# Patient Record
Sex: Female | Born: 1985
Health system: Southern US, Community
[De-identification: ages and names within clinical notes are randomized; demographics above are authoritative.]

## PROBLEM LIST (undated history)

## (undated) DIAGNOSIS — N760 Acute vaginitis: Secondary | ICD-10-CM

## (undated) DIAGNOSIS — Z23 Encounter for immunization: Secondary | ICD-10-CM

## (undated) DIAGNOSIS — R87612 Low grade squamous intraepithelial lesion on cytologic smear of cervix (LGSIL): Secondary | ICD-10-CM

## (undated) DIAGNOSIS — B009 Herpesviral infection, unspecified: Secondary | ICD-10-CM

## (undated) DIAGNOSIS — E559 Vitamin D deficiency, unspecified: Secondary | ICD-10-CM

## (undated) DIAGNOSIS — N879 Dysplasia of cervix uteri, unspecified: Secondary | ICD-10-CM

## (undated) DIAGNOSIS — A599 Trichomoniasis, unspecified: Secondary | ICD-10-CM

## (undated) DIAGNOSIS — R7303 Prediabetes: Secondary | ICD-10-CM

## (undated) DIAGNOSIS — N915 Oligomenorrhea, unspecified: Secondary | ICD-10-CM

## (undated) HISTORY — PX: TONSILLECTOMY: SUR1361

## (undated) HISTORY — DX: Prediabetes: R73.03

## (undated) HISTORY — DX: Herpesviral infection, unspecified: B00.9

## (undated) HISTORY — DX: Acute vaginitis: N76.0

## (undated) HISTORY — DX: Dysplasia of cervix uteri, unspecified: N87.9

## (undated) HISTORY — DX: Oligomenorrhea, unspecified: N91.5

## (undated) HISTORY — DX: Low grade squamous intraepithelial lesion on cytologic smear of cervix (LGSIL): R87.612

## (undated) HISTORY — DX: Encounter for immunization: Z23

## (undated) HISTORY — DX: Trichomoniasis, unspecified: A59.9

## (undated) HISTORY — PX: WISDOM TOOTH EXTRACTION: SHX21

## (undated) HISTORY — DX: Vitamin D deficiency, unspecified: E55.9

## (undated) HISTORY — PX: COLPOSCOPY: SHX161

---

## 2007-02-22 ENCOUNTER — Emergency Department: Payer: Self-pay | Admitting: Emergency Medicine
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
## 2015-09-20 DIAGNOSIS — H52223 Regular astigmatism, bilateral: Secondary | ICD-10-CM | POA: Diagnosis not present

## 2015-12-01 ENCOUNTER — Encounter: Payer: Self-pay | Admitting: Physician Assistant

## 2015-12-01 ENCOUNTER — Ambulatory Visit: Payer: Self-pay | Admitting: Physician Assistant

## 2015-12-01 VITALS — BP 128/98 | HR 76 | Temp 98.4°F

## 2015-12-01 DIAGNOSIS — R0981 Nasal congestion: Secondary | ICD-10-CM

## 2015-12-01 DIAGNOSIS — H6501 Acute serous otitis media, right ear: Secondary | ICD-10-CM

## 2015-12-01 MED ORDER — FEXOFENADINE-PSEUDOEPHED ER 60-120 MG PO TB12: 1.0000 | ORAL_TABLET | Freq: Two times a day (BID) | ORAL | Status: AC

## 2015-12-01 MED ORDER — CLARITHROMYCIN 500 MG PO TABS: 500.0000 mg | ORAL_TABLET | Freq: Two times a day (BID) | ORAL | Status: DC

## 2015-12-01 NOTE — Progress Notes (Signed)
   Subjective:right ear pain/sinus congestion    Patient ID: Colleen Hernandez, female    DOB: [birth year], 30 y.o.   MRN: [id number]  HPI Patient c/o sinus congestion for one week. Developed right ear pain 2 days ago.  Denies fever/chill, or N/V/D. Mild hearing loss right ear. No palliative measure for compliant.   Review of Systems    Hypothyroidism Objective:   Physical Exam HEENT for bilateral maxillary guarding. Edematous bilateral nasal turbinates. Edematous/erythematous right TM. Left TM is bulging with erythema. Post nasal drainage. Neck supple without adenopathy. Lungs CTA, and Heart RRR.      Assessment & Plan:Sinus congestion with right Otitis media.  Start Biaxin and Allergra-D as directed. Follow one week if no improvement.

## 2016-01-01 DIAGNOSIS — R5383 Other fatigue: Secondary | ICD-10-CM | POA: Diagnosis not present

## 2016-01-01 DIAGNOSIS — E669 Obesity, unspecified: Secondary | ICD-10-CM | POA: Diagnosis not present

## 2016-01-29 DIAGNOSIS — R5383 Other fatigue: Secondary | ICD-10-CM | POA: Diagnosis not present

## 2016-01-29 DIAGNOSIS — E559 Vitamin D deficiency, unspecified: Secondary | ICD-10-CM | POA: Diagnosis not present

## 2016-01-29 DIAGNOSIS — D519 Vitamin B12 deficiency anemia, unspecified: Secondary | ICD-10-CM | POA: Diagnosis not present

## 2016-01-29 DIAGNOSIS — E669 Obesity, unspecified: Secondary | ICD-10-CM | POA: Diagnosis not present

## 2016-01-29 DIAGNOSIS — R7301 Impaired fasting glucose: Secondary | ICD-10-CM | POA: Diagnosis not present

## 2016-02-05 DIAGNOSIS — G4710 Hypersomnia, unspecified: Secondary | ICD-10-CM | POA: Diagnosis not present

## 2016-02-05 DIAGNOSIS — E669 Obesity, unspecified: Secondary | ICD-10-CM | POA: Diagnosis not present

## 2016-02-05 DIAGNOSIS — E559 Vitamin D deficiency, unspecified: Secondary | ICD-10-CM | POA: Diagnosis not present

## 2016-02-08 DIAGNOSIS — R0683 Snoring: Secondary | ICD-10-CM | POA: Diagnosis not present

## 2016-02-08 DIAGNOSIS — G4710 Hypersomnia, unspecified: Secondary | ICD-10-CM | POA: Diagnosis not present

## 2016-03-05 DIAGNOSIS — Z315 Encounter for genetic counseling: Secondary | ICD-10-CM | POA: Diagnosis not present

## 2016-03-05 DIAGNOSIS — N915 Oligomenorrhea, unspecified: Secondary | ICD-10-CM | POA: Diagnosis not present

## 2016-03-05 DIAGNOSIS — Z01419 Encounter for gynecological examination (general) (routine) without abnormal findings: Secondary | ICD-10-CM | POA: Diagnosis not present

## 2016-03-05 DIAGNOSIS — Z8041 Family history of malignant neoplasm of ovary: Secondary | ICD-10-CM | POA: Diagnosis not present

## 2016-03-05 DIAGNOSIS — Z8742 Personal history of other diseases of the female genital tract: Secondary | ICD-10-CM | POA: Diagnosis not present

## 2016-03-05 DIAGNOSIS — Z124 Encounter for screening for malignant neoplasm of cervix: Secondary | ICD-10-CM | POA: Diagnosis not present

## 2016-03-05 DIAGNOSIS — R030 Elevated blood-pressure reading, without diagnosis of hypertension: Secondary | ICD-10-CM | POA: Diagnosis not present

## 2016-03-05 DIAGNOSIS — Z3041 Encounter for surveillance of contraceptive pills: Secondary | ICD-10-CM | POA: Diagnosis not present

## 2016-03-06 DIAGNOSIS — G4710 Hypersomnia, unspecified: Secondary | ICD-10-CM | POA: Diagnosis not present

## 2016-03-06 DIAGNOSIS — E669 Obesity, unspecified: Secondary | ICD-10-CM | POA: Diagnosis not present

## 2016-04-15 DIAGNOSIS — E669 Obesity, unspecified: Secondary | ICD-10-CM | POA: Diagnosis not present

## 2016-04-15 DIAGNOSIS — E559 Vitamin D deficiency, unspecified: Secondary | ICD-10-CM | POA: Diagnosis not present

## 2016-05-21 DIAGNOSIS — E669 Obesity, unspecified: Secondary | ICD-10-CM | POA: Diagnosis not present

## 2016-07-22 DIAGNOSIS — R5383 Other fatigue: Secondary | ICD-10-CM | POA: Diagnosis not present

## 2016-07-22 DIAGNOSIS — E669 Obesity, unspecified: Secondary | ICD-10-CM | POA: Diagnosis not present

## 2016-11-26 ENCOUNTER — Telehealth: Payer: Self-pay

## 2016-11-26 MED ORDER — LEVONORGESTREL-ETHINYL ESTRAD 0.1-20 MG-MCG PO TABS: 1.0000 | ORAL_TABLET | Freq: Every day | ORAL | 3 refills | Status: DC

## 2016-11-26 NOTE — Telephone Encounter (Signed)
Pt has changed pharmacies and need bc called in.  New pharm is [facility] on [address]  Laury AxonNeeds bcp sent in thru Sept when annual is due.  Pt aware pharm changed and rx sent.

## 2016-12-26 DIAGNOSIS — E669 Obesity, unspecified: Secondary | ICD-10-CM | POA: Diagnosis not present

## 2017-01-27 ENCOUNTER — Other Ambulatory Visit: Payer: Self-pay

## 2017-01-27 MED ORDER — LEVONORGESTREL-ETHINYL ESTRAD 0.1-20 MG-MCG PO TABS: 1.0000 | ORAL_TABLET | Freq: Every day | ORAL | 0 refills | Status: DC

## 2017-02-20 DIAGNOSIS — J309 Allergic rhinitis, unspecified: Secondary | ICD-10-CM | POA: Diagnosis not present

## 2017-02-20 DIAGNOSIS — E669 Obesity, unspecified: Secondary | ICD-10-CM | POA: Diagnosis not present

## 2017-03-26 ENCOUNTER — Encounter: Payer: Self-pay | Admitting: Obstetrics and Gynecology

## 2017-03-26 ENCOUNTER — Ambulatory Visit (INDEPENDENT_AMBULATORY_CARE_PROVIDER_SITE_OTHER): Payer: BLUE CROSS/BLUE SHIELD | Admitting: Obstetrics and Gynecology

## 2017-03-26 VITALS — BP 140/80 | HR 77 | Ht 65.0 in | Wt 249.0 lb

## 2017-03-26 DIAGNOSIS — Z8041 Family history of malignant neoplasm of ovary: Secondary | ICD-10-CM | POA: Diagnosis not present

## 2017-03-26 DIAGNOSIS — Z3041 Encounter for surveillance of contraceptive pills: Secondary | ICD-10-CM | POA: Diagnosis not present

## 2017-03-26 DIAGNOSIS — Z124 Encounter for screening for malignant neoplasm of cervix: Secondary | ICD-10-CM | POA: Diagnosis not present

## 2017-03-26 DIAGNOSIS — Z01419 Encounter for gynecological examination (general) (routine) without abnormal findings: Secondary | ICD-10-CM

## 2017-03-26 MED ORDER — LEVONORGESTREL-ETHINYL ESTRAD 0.1-20 MG-MCG PO TABS: 1.0000 | ORAL_TABLET | Freq: Every day | ORAL | 3 refills | Status: DC

## 2017-03-26 NOTE — Progress Notes (Signed)
PCP:  Carlean Jews, NP   Chief Complaint  Patient presents with  . Gynecologic Exam     HPI:      Ms. Colleen Hernandez is a 31 y.o. No obstetric history on file. who LMP was Patient's last menstrual period was 02/19/2017., presents today for her annual examination.  Her menses are regular every 28-30 days, lasting 4 days.  Dysmenorrhea none. She does not have intermenstrual bleeding.  Sex activity: not sexually active.  Last Pap: March 05, 2016  Results were: no abnormalities  Hx of STDs: HPV on pap and HSV, takes valtrex prn.   There is no FH of breast cancer. There is a FH of ovarian cancer in her mat aunt, colon cancer in her mat uncle, and stomach cancer in her MGM. Pt has declined genetic testing in the past. The patient does do self-breast exams.  Tobacco use: The patient denies current or previous tobacco use. Alcohol use: social drinker No drug use.  Exercise: moderately active  She does get adequate calcium and Vitamin D in her diet.  Doing wt loss through PCP. Labs with PCP.     Past Medical History:  Diagnosis Date  . Cervical dysplasia   . Herpes   . LGSIL on Pap smear of cervix   . Oligomenorrhea   . Pre-diabetes   . Trichomonosis   . Vaccine for human papilloma virus (HPV) types 6, 11, 16, and 18 administered   . Vitamin D deficiency   . Vulvovaginitis     Past Surgical History:  Procedure Laterality Date  . COLPOSCOPY    . TONSILLECTOMY    . WISDOM TOOTH EXTRACTION      Family History  Problem Relation Age of Onset  . Stomach cancer Maternal Grandmother 7  . Diabetes Paternal Grandmother   . Heart disease Paternal Grandfather   . Colon cancer Maternal Uncle 56  . Diabetes Maternal Uncle   . Diabetes Maternal Aunt   . Ovarian cancer Maternal Aunt 99    Social History   Social History  . Marital status: Single    Spouse name: N/A  . Number of children: N/A  . Years of education: N/A   Occupational History  . Not on file.     Social History Main Topics  . Smoking status: Never Smoker  . Smokeless tobacco: Never Used  . Alcohol use 0.0 oz/week  . Drug use: No  . Sexual activity: Not Currently   Other Topics Concern  . Not on file   Social History Narrative  . No narrative on file    Current Meds  Medication Sig  . fexofenadine-pseudoephedrine (ALLEGRA-D) 60-120 MG 12 hr tablet Take 1 tablet by mouth 2 (two) times daily.  Marland Kitchen levonorgestrel-ethinyl estradiol (AVIANE,ALESSE,LESSINA) 0.1-20 MG-MCG tablet Take 1 tablet by mouth daily.  . [DISCONTINUED] levonorgestrel-ethinyl estradiol (AVIANE,ALESSE,LESSINA) 0.1-20 MG-MCG tablet Take 1 tablet by mouth daily.     ROS:  Review of Systems  Constitutional: Negative for fatigue, fever and unexpected weight change.  Respiratory: Negative for cough, shortness of breath and wheezing.   Cardiovascular: Negative for chest pain, palpitations and leg swelling.  Gastrointestinal: Negative for blood in stool, constipation, diarrhea, nausea and vomiting.  Endocrine: Negative for cold intolerance, heat intolerance and polyuria.  Genitourinary: Negative for dyspareunia, dysuria, flank pain, frequency, genital sores, hematuria, menstrual problem, pelvic pain, urgency, vaginal bleeding, vaginal discharge and vaginal pain.  Musculoskeletal: Negative for back pain, joint swelling and myalgias.  Skin: Negative for rash.  Neurological: Negative for dizziness, syncope, light-headedness, numbness and headaches.  Hematological: Negative for adenopathy.  Psychiatric/Behavioral: Negative for agitation, confusion, sleep disturbance and suicidal ideas. The patient is not nervous/anxious.      Objective: BP 140/80   Pulse 77   Ht 5\' 5"  (1.651 m)   Wt 249 lb (112.9 kg)   LMP 02/19/2017   BMI 41.44 kg/m    Physical Exam  Constitutional: She is oriented to person, place, and time. She appears well-developed and well-nourished.  Genitourinary: Vagina normal and uterus  normal. There is no rash or tenderness on the right labia. There is no rash or tenderness on the left labia. No erythema or tenderness in the vagina. No vaginal discharge found. Right adnexum does not display mass and does not display tenderness. Left adnexum does not display mass and does not display tenderness. Cervix does not exhibit motion tenderness or polyp. Uterus is not enlarged or tender.  Neck: Normal range of motion. No thyromegaly present.  Cardiovascular: Normal rate, regular rhythm and normal heart sounds.   No murmur heard. Pulmonary/Chest: Effort normal and breath sounds normal. Right breast exhibits no mass, no nipple discharge, no skin change and no tenderness. Left breast exhibits no mass, no nipple discharge, no skin change and no tenderness.  Abdominal: Soft. There is no tenderness. There is no guarding.  Musculoskeletal: Normal range of motion.  Neurological: She is alert and oriented to person, place, and time. No cranial nerve deficit.  Psychiatric: She has a normal mood and affect. Her behavior is normal.  Vitals reviewed.   Assessment/Plan: Encounter for annual routine gynecological examination  Cervical cancer screening - Plan: IGP, rfx Aptima HPV ASCU  Encounter for surveillance of contraceptive pills - OCP RF - Plan: levonorgestrel-ethinyl estradiol (AVIANE,ALESSE,LESSINA) 0.1-20 MG-MCG tablet  Family history of ovarian cancer - MyRisk testing discussed given FH of Lynch syndrome cancers. Pt declines. F/u if desires.             GYN counsel adequate intake of calcium and vitamin D, diet and exercise     F/U  Return in about 1 year (around 03/26/2018).  Jayant Kriz B. Royalti Schauf, PA-C 03/26/2017 4:24 PM

## 2017-03-27 ENCOUNTER — Other Ambulatory Visit: Payer: Self-pay | Admitting: Obstetrics and Gynecology

## 2017-03-27 DIAGNOSIS — Z3041 Encounter for surveillance of contraceptive pills: Secondary | ICD-10-CM

## 2017-03-27 MED ORDER — LEVONORGESTREL-ETHINYL ESTRAD 0.1-20 MG-MCG PO TABS: 1.0000 | ORAL_TABLET | Freq: Every day | ORAL | 3 refills | Status: DC

## 2017-03-28 LAB — IGP, RFX APTIMA HPV ASCU: PAP SMEAR COMMENT: 0

## 2017-12-03 ENCOUNTER — Telehealth: Payer: Self-pay

## 2017-12-03 NOTE — Telephone Encounter (Signed)
Pt states she needs a referral so she can get laser hair removal done because she has PCOS and would like her insurance to cover it and be filed under medically necessary. Please advise. Thank you!

## 2017-12-03 NOTE — Telephone Encounter (Signed)
Where does she want to be referred to? Don't know that insurance will cover it.

## 2017-12-04 ENCOUNTER — Other Ambulatory Visit: Payer: Self-pay | Admitting: Obstetrics and Gynecology

## 2017-12-04 DIAGNOSIS — L680 Hirsutism: Secondary | ICD-10-CM

## 2017-12-04 NOTE — Telephone Encounter (Signed)
Ref order placed and Harriett Sineancy working on this.

## 2017-12-04 NOTE — Progress Notes (Signed)
Pt states she needs a referral so she can get laser hair removal done because she has PCOS and would like her insurance to cover it and be filed under medically necessary.

## 2017-12-04 NOTE — Telephone Encounter (Signed)
Called pt and she would like to be referred to [facility] located in [place].

## 2018-01-12 DIAGNOSIS — H52223 Regular astigmatism, bilateral: Secondary | ICD-10-CM | POA: Diagnosis not present

## 2018-05-05 ENCOUNTER — Other Ambulatory Visit (HOSPITAL_COMMUNITY)
Admission: RE | Admit: 2018-05-05 | Discharge: 2018-05-05 | Disposition: A | Discharge status: Home / Self Care | Payer: BLUE CROSS/BLUE SHIELD | Source: Ambulatory Visit | Attending: Obstetrics and Gynecology | Admitting: Obstetrics and Gynecology

## 2018-05-05 ENCOUNTER — Ambulatory Visit (INDEPENDENT_AMBULATORY_CARE_PROVIDER_SITE_OTHER): Payer: BLUE CROSS/BLUE SHIELD | Admitting: Obstetrics and Gynecology

## 2018-05-05 ENCOUNTER — Encounter: Payer: Self-pay | Admitting: Obstetrics and Gynecology

## 2018-05-05 VITALS — BP 116/80 | HR 64 | Ht 65.0 in | Wt 255.0 lb

## 2018-05-05 DIAGNOSIS — Z8041 Family history of malignant neoplasm of ovary: Secondary | ICD-10-CM

## 2018-05-05 DIAGNOSIS — Z1151 Encounter for screening for human papillomavirus (HPV): Secondary | ICD-10-CM | POA: Diagnosis not present

## 2018-05-05 DIAGNOSIS — E282 Polycystic ovarian syndrome: Secondary | ICD-10-CM

## 2018-05-05 DIAGNOSIS — Z01419 Encounter for gynecological examination (general) (routine) without abnormal findings: Secondary | ICD-10-CM | POA: Diagnosis not present

## 2018-05-05 DIAGNOSIS — Z124 Encounter for screening for malignant neoplasm of cervix: Secondary | ICD-10-CM | POA: Insufficient documentation

## 2018-05-05 DIAGNOSIS — Z3041 Encounter for surveillance of contraceptive pills: Secondary | ICD-10-CM

## 2018-05-05 MED ORDER — LEVONORGESTREL-ETHINYL ESTRAD 0.1-20 MG-MCG PO TABS: 1.0000 | ORAL_TABLET | Freq: Every day | ORAL | 3 refills | Status: DC

## 2018-05-05 NOTE — Patient Instructions (Signed)
I value your feedback and entrusting us with your care. If you get a [facility] patient survey, I would appreciate you taking the time to let us know about your experience today. Thank you! 

## 2018-05-05 NOTE — Progress Notes (Signed)
PCP:  Carlean JewsBoscia, Heather E, NP   Chief Complaint  Patient presents with  . Gynecologic Exam     HPI:      Ms. Colleen Hernandez is a 32 y.o. No obstetric history on file. who LMP was Patient's last menstrual period was 05/03/2018 (exact date)., presents today for her annual examination.  Her menses are regular every 28-30 days, lasting 4 days.  Dysmenorrhea mild before menses, sx increased this yr. Takes NSAIDs with sx relief. She does not have intermenstrual bleeding. Takes OCPs for cycle control due to PCOS.  Sex activity: not sexually active.  Last Pap: 03/26/17 Results were: no abnormalities  Hx of STDs: HPV on pap and HSV, takes valtrex prn.   There is no FH of breast cancer. There is a FH of ovarian cancer in her mat aunt, colon cancer in her mat uncle, and stomach cancer in her MGM. Pt has declined genetic testing last yr and in the past. The patient does do self-breast exams.  Tobacco use: The patient denies current or previous tobacco use. Alcohol use: social drinker No drug use.  Exercise: moderately active  She does get adequate calcium and Vitamin D in her diet.  Doing wt loss through PCP. Labs with PCP.    Past Medical History:  Diagnosis Date  . Cervical dysplasia   . Herpes   . LGSIL on Pap smear of cervix   . Oligomenorrhea   . Pre-diabetes   . Trichomonosis   . Vaccine for human papilloma virus (HPV) types 6, 11, 16, and 18 administered   . Vitamin D deficiency   . Vulvovaginitis     Past Surgical History:  Procedure Laterality Date  . COLPOSCOPY    . TONSILLECTOMY    . WISDOM TOOTH EXTRACTION      Family History  Problem Relation Age of Onset  . Stomach cancer Maternal Grandmother [id number]  . Diabetes Paternal Grandmother   . Heart disease Paternal Grandfather   . Colon cancer Maternal Uncle 56  . Diabetes Maternal Uncle   . Diabetes Maternal Aunt   . Ovarian cancer Maternal Aunt 5256    Social History   Socioeconomic History  . Marital status:  Single    Spouse name: Not on file  . Number of children: Not on file  . Years of education: Not on file  . Highest education level: Not on file  Occupational History  . Not on file  Social Needs  . Financial resource strain: Not on file  . Food insecurity:    Worry: Not on file    Inability: Not on file  . Transportation needs:    Medical: Not on file    Non-medical: Not on file  Tobacco Use  . Smoking status: Never Smoker  . Smokeless tobacco: Never Used  Substance and Sexual Activity  . Alcohol use: Yes    Alcohol/week: 0.0 standard drinks    Comment: socially  . Drug use: No  . Sexual activity: Not Currently    Birth control/protection: Pill  Lifestyle  . Physical activity:    Days per week: Not on file    Minutes per session: Not on file  . Stress: Not on file  Relationships  . Social connections:    Talks on phone: Not on file    Gets together: Not on file    Attends religious service: Not on file    Active member of club or organization: Not on file    Attends meetings  of clubs or organizations: Not on file    Relationship status: Not on file  . Intimate partner violence:    Fear of current or ex partner: Not on file    Emotionally abused: Not on file    Physically abused: Not on file    Forced sexual activity: Not on file  Other Topics Concern  . Not on file  Social History Narrative  . Not on file    Current Meds  Medication Sig  . fexofenadine-pseudoephedrine (ALLEGRA-D) 60-120 MG 12 hr tablet Take 1 tablet by mouth 2 (two) times daily.  Marland Kitchen levonorgestrel-ethinyl estradiol (AVIANE,ALESSE,LESSINA) 0.1-20 MG-MCG tablet Take 1 tablet by mouth daily.  . phentermine (ADIPEX-P) 37.5 MG tablet Take 37.5 mg by mouth daily.  . [DISCONTINUED] levonorgestrel-ethinyl estradiol (AVIANE,ALESSE,LESSINA) 0.1-20 MG-MCG tablet Take 1 tablet by mouth daily.     ROS:  Review of Systems  Constitutional: Negative for fatigue, fever and unexpected weight change.    Respiratory: Negative for cough, shortness of breath and wheezing.   Cardiovascular: Negative for chest pain, palpitations and leg swelling.  Gastrointestinal: Negative for blood in stool, constipation, diarrhea, nausea and vomiting.  Endocrine: Negative for cold intolerance, heat intolerance and polyuria.  Genitourinary: Negative for dyspareunia, dysuria, flank pain, frequency, genital sores, hematuria, menstrual problem, pelvic pain, urgency, vaginal bleeding, vaginal discharge and vaginal pain.  Musculoskeletal: Negative for back pain, joint swelling and myalgias.  Skin: Negative for rash.  Neurological: Negative for dizziness, syncope, light-headedness, numbness and headaches.  Hematological: Negative for adenopathy.  Psychiatric/Behavioral: Negative for agitation, confusion, sleep disturbance and suicidal ideas. The patient is not nervous/anxious.      Objective: BP 116/80   Pulse 64   Ht 5\' 5"  (1.651 m)   Wt 255 lb (115.7 kg)   LMP 05/03/2018 (Exact Date)   BMI 42.43 kg/m    Physical Exam  Constitutional: She is oriented to person, place, and time. She appears well-developed and well-nourished.  Genitourinary: Uterus normal. There is no rash or tenderness on the right labia. There is no rash or tenderness on the left labia. There is bleeding in the vagina. No erythema or tenderness in the vagina. No vaginal discharge found. Right adnexum does not display mass and does not display tenderness. Left adnexum does not display mass and does not display tenderness. Cervix does not exhibit motion tenderness or polyp. Uterus is not enlarged or tender.  Neck: Normal range of motion. No thyromegaly present.  Cardiovascular: Normal rate, regular rhythm and normal heart sounds.  No murmur heard. Pulmonary/Chest: Effort normal and breath sounds normal. Right breast exhibits no mass, no nipple discharge, no skin change and no tenderness. Left breast exhibits no mass, no nipple discharge, no  skin change and no tenderness.  Abdominal: Soft. There is no tenderness. There is no guarding.  Musculoskeletal: Normal range of motion.  Neurological: She is alert and oriented to person, place, and time. No cranial nerve deficit.  Psychiatric: She has a normal mood and affect. Her behavior is normal.  Vitals reviewed.   Assessment/Plan: Encounter for annual routine gynecological examination  Cervical cancer screening - Plan: Cytology - PAP  Screening for HPV (human papillomavirus) - Plan: Cytology - PAP  Encounter for surveillance of contraceptive pills - OCP RF - Plan: levonorgestrel-ethinyl estradiol (AVIANE,ALESSE,LESSINA) 0.1-20 MG-MCG tablet  PCOS (polycystic ovarian syndrome) - Plan: levonorgestrel-ethinyl estradiol (AVIANE,ALESSE,LESSINA) 0.1-20 MG-MCG tablet  Family history of ovarian cancer - MyRisk testing discussed. Pt declines. F/u prn.   Meds ordered this encounter  Medications  . levonorgestrel-ethinyl estradiol (AVIANE,ALESSE,LESSINA) 0.1-20 MG-MCG tablet    Sig: Take 1 tablet by mouth daily.    Dispense:  84 tablet    Refill:  3    Order Specific Question:   Supervising Provider    Answer:   Nadara Mustard [[id number]]              GYN counsel adequate intake of calcium and vitamin D, diet and exercise     F/U  Return in about 1 year (around 05/06/2019).  Cruz Bong B. Ludell Zacarias, PA-C 05/05/2018 4:35 PM

## 2018-05-11 LAB — CYTOLOGY - PAP
DIAGNOSIS: NEGATIVE
HPV: NOT DETECTED

## 2019-04-16 ENCOUNTER — Other Ambulatory Visit: Payer: Self-pay

## 2019-04-16 ENCOUNTER — Encounter: Payer: Self-pay | Admitting: Nurse Practitioner

## 2019-04-16 ENCOUNTER — Ambulatory Visit (INDEPENDENT_AMBULATORY_CARE_PROVIDER_SITE_OTHER): Payer: BLUE CROSS/BLUE SHIELD | Admitting: Nurse Practitioner

## 2019-04-16 VITALS — BP 120/73 | HR 76 | Temp 98.0°F | Resp 16 | Ht 65.0 in | Wt 251.0 lb

## 2019-04-16 DIAGNOSIS — R635 Abnormal weight gain: Secondary | ICD-10-CM

## 2019-04-16 DIAGNOSIS — R300 Dysuria: Secondary | ICD-10-CM | POA: Diagnosis not present

## 2019-04-16 DIAGNOSIS — Z8249 Family history of ischemic heart disease and other diseases of the circulatory system: Secondary | ICD-10-CM | POA: Diagnosis not present

## 2019-04-16 DIAGNOSIS — Z0001 Encounter for general adult medical examination with abnormal findings: Secondary | ICD-10-CM | POA: Diagnosis not present

## 2019-04-16 NOTE — Progress Notes (Signed)
[facility] [address] [place], [place] [address]  Internal MEDICINE  Office Visit Note  Patient Name: Colleen Hernandez  [id number]  [id number]  Date of Service: 05/02/2019   Pt is here for routine health maintenance examination  Chief Complaint  Patient presents with  . Annual Exam     Ms. Beverlin presents to clinic for an annual physical exam. She reports that she has a strong family history of heart disease, and wants to stay on top of her health status. Her father had a heart transplant at 68, her uncle had to have LVAD, her grandfather passed away from MI at a young age. Ms. Pelaez denies any congenital heart problems. She reports that occasionally she has chest pressure or palpitations when feeling stressed or anxious, but denies any symptoms at rest. She has previously taken phentermine for weight management, but states that due to potential cardiac-related side effects, she prefers not to restart it at this time. She states that she is continuing to work on her weight through diet and exercise, and has a goal to lose 20-30 lbs.    Current Medication: Outpatient Encounter Medications as of 04/16/2019  Medication Sig  . fexofenadine-pseudoephedrine (ALLEGRA-D) 60-120 MG 12 hr tablet Take 1 tablet by mouth 2 (two) times daily.  Marland Kitchen levonorgestrel-ethinyl estradiol (AVIANE,ALESSE,LESSINA) 0.1-20 MG-MCG tablet Take 1 tablet by mouth daily.  . phentermine (ADIPEX-P) 37.5 MG tablet Take 37.5 mg by mouth daily.   No facility-administered encounter medications on file as of 04/16/2019.     Surgical History: Past Surgical History:  Procedure Laterality Date  . COLPOSCOPY    . TONSILLECTOMY    . WISDOM TOOTH EXTRACTION      Medical History: Past Medical History:  Diagnosis Date  . Cervical dysplasia   . Herpes   . LGSIL on Pap smear of cervix   . Oligomenorrhea   . Pre-diabetes   . Trichomonosis   . Vaccine for human papilloma virus (HPV) types 6, 11, 16, and  18 administered   . Vitamin D deficiency   . Vulvovaginitis     Family History: Family History  Problem Relation Age of Onset  . Stomach cancer Maternal Grandmother 96  . Diabetes Paternal Grandmother   . Heart disease Paternal Grandfather   . Colon cancer Maternal Uncle 56  . Diabetes Maternal Uncle   . Diabetes Maternal Aunt   . Ovarian cancer Maternal Aunt 56  . Heart disease Father       Review of Systems  Constitutional: Negative for chills, fatigue and fever.  HENT: Positive for postnasal drip and sinus pressure. Negative for ear pain, rhinorrhea, sore throat and tinnitus.        Symptoms related to seasonal allergies  Eyes: Negative for photophobia, pain and visual disturbance.  Respiratory: Negative for shortness of breath and wheezing.   Cardiovascular: Negative for chest pain, palpitations and leg swelling.  Gastrointestinal: Negative for abdominal pain, diarrhea, nausea and vomiting.  Endocrine: Negative for cold intolerance, heat intolerance and polydipsia.  Genitourinary: Negative for difficulty urinating, dysuria, frequency and vaginal bleeding.  Musculoskeletal: Negative for arthralgias, myalgias and neck stiffness.  Skin: Negative for rash and wound.  Allergic/Immunologic: Positive for environmental allergies. Negative for food allergies.  Neurological: Negative for dizziness, numbness and headaches.  Hematological: Negative for adenopathy.  Psychiatric/Behavioral: Negative for decreased concentration. The patient is nervous/anxious.        Reports occasionally feeling stressed or anxious related to work and life events  Today's Vitals   04/16/19 1502  BP: 120/73  Pulse: 76  Resp: 16  Temp: 98 F (36.7 C)  SpO2: 98%  Weight: 251 lb (113.9 kg)  Height: 5\' 5"  (1.651 m)   Body mass index is 41.77 kg/m.  Physical Exam Vitals signs reviewed.  Constitutional:      Appearance: Normal appearance.  HENT:     Head: Normocephalic and atraumatic.      Nose: Nose normal.  Eyes:     Extraocular Movements: Extraocular movements intact.     Pupils: Pupils are equal, round, and reactive to light.  Neck:     Musculoskeletal: Normal range of motion and neck supple.  Cardiovascular:     Rate and Rhythm: Normal rate and regular rhythm.     Pulses: Normal pulses.     Heart sounds: Normal heart sounds.  Pulmonary:     Effort: Pulmonary effort is normal.     Breath sounds: Normal breath sounds.  Abdominal:     General: Bowel sounds are normal.     Palpations: Abdomen is soft.     Tenderness: There is no abdominal tenderness.  Musculoskeletal: Normal range of motion.  Skin:    General: Skin is warm and dry.     Capillary Refill: Capillary refill takes less than 2 seconds.  Neurological:     Mental Status: She is alert and oriented to person, place, and time.  Psychiatric:        Mood and Affect: Mood normal.        Behavior: Behavior normal.        Thought Content: Thought content normal.        Judgment: Judgment normal.   LABS: Recent Results (from the past 2160 hour(s))  Comprehensive metabolic panel     Status: None   Collection Time: 04/19/19 10:01 AM  Result Value Ref Range   Glucose 93 65 - 99 mg/dL   BUN 13 6 - 20 mg/dL   Creatinine, Ser 0.68 0.57 - 1.00 mg/dL   GFR calc non Af Amer 117 >59 mL/min/1.73   GFR calc Af Amer 135 >59 mL/min/1.73   BUN/Creatinine Ratio 19 9 - 23   Sodium 137 134 - 144 mmol/L   Potassium 4.1 3.5 - 5.2 mmol/L   Chloride 104 96 - 106 mmol/L   CO2 21 20 - 29 mmol/L   Calcium 8.9 8.7 - 10.2 mg/dL   Total Protein 6.3 6.0 - 8.5 g/dL   Albumin 4.0 3.8 - 4.8 g/dL   Globulin, Total 2.3 1.5 - 4.5 g/dL   Albumin/Globulin Ratio 1.7 1.2 - 2.2   Bilirubin Total 0.3 0.0 - 1.2 mg/dL   Alkaline Phosphatase 51 39 - 117 IU/L   AST 20 0 - 40 IU/L   ALT 19 0 - 32 IU/L  CBC     Status: None   Collection Time: 04/19/19 10:01 AM  Result Value Ref Range   WBC 7.0 3.4 - 10.8 x10E3/uL   RBC 4.14 3.77 - 5.28  x10E6/uL   Hemoglobin 12.1 11.1 - 15.9 g/dL   Hematocrit 35.7 34.0 - 46.6 %   MCV 86 79 - 97 fL   MCH 29.2 26.6 - 33.0 pg   MCHC 33.9 31.5 - 35.7 g/dL   RDW 12.9 11.7 - 15.4 %   Platelets 261 150 - 450 x10E3/uL  Lipid Panel w/o Chol/HDL Ratio     Status: None   Collection Time: 04/19/19 10:01 AM  Result Value Ref Range  Cholesterol, Total 174 100 - 199 mg/dL   Triglycerides 35 0 - 149 mg/dL   HDL 67 >36>39 mg/dL   VLDL Cholesterol Cal 8 5 - 40 mg/dL   LDL Chol Calc (NIH) 99 0 - 99 mg/dL  Hgb U4QA1c w/o eAG     Status: None   Collection Time: 04/19/19 10:01 AM  Result Value Ref Range   Hgb A1c MFr Bld 5.6 4.8 - 5.6 %    Comment:          Prediabetes: 5.7 - 6.4          Diabetes: >6.4          Glycemic control for adults with diabetes: <7.0   T4, free     Status: None   Collection Time: 04/19/19 10:01 AM  Result Value Ref Range   Free T4 1.10 0.82 - 1.77 ng/dL  TSH     Status: None   Collection Time: 04/19/19 10:01 AM  Result Value Ref Range   TSH 1.950 0.450 - 4.500 uIU/mL  VITAMIN D 25 Hydroxy (Vit-D Deficiency, Fractures)     Status: Abnormal   Collection Time: 04/19/19 10:01 AM  Result Value Ref Range   Vit D, 25-Hydroxy 28.8 (L) 30.0 - 100.0 ng/mL    Comment: Vitamin D deficiency has been defined by the Institute of Medicine and an Endocrine Society practice guideline as a level of serum 25-OH vitamin D less than 20 ng/mL (1,2). The Endocrine Society went on to further define vitamin D insufficiency as a level between 21 and 29 ng/mL (2). 1. IOM (Institute of Medicine). 2010. Dietary reference    intakes for calcium and D. [place]: The    [employer]. 2. Holick MF, Binkley [place], Bischoff-Ferrari HA, et al.    Evaluation, treatment, and prevention of vitamin D    deficiency: an Endocrine Society clinical practice    guideline. JCEM. 2011 Jul; 96(7):1911-30.     Assessment/Plan: 1. Encounter for general adult medical examination with abnormal  findings Annual health maintenance exam today  2. Family history of premature CAD Will get echo and stress test for further evaluation.  - ECHOCARDIOGRAM COMPLETE; Future - stress test; Future  3. Abnormal weight gain Patient plans to follow low calorie diet, 1200-1500 calories per day. Will incorporate exercise into daily routine.   4. Dysuria - UA/M w/rflx Culture, Routine  General Counseling: Anastasija verbalizes understanding of the findings of todays visit and agrees with plan of treatment. I have discussed any further diagnostic evaluation that may be needed or ordered today. We also reviewed her medications today. she has been encouraged to call the office with any questions or concerns that should arise related to todays visit.    Counseling:  This patient was seen by Vincent GrosHeather Shaquinta Peruski FNP Collaboration with Dr Lyndon CodeFozia M Khan as a part of collaborative care agreement  Orders Placed This Encounter  Procedures  . UA/M w/rflx Culture, Routine  . stress test  . ECHOCARDIOGRAM COMPLETE      Time spent: 1930 Minutes      Lyndon CodeFozia M Khan, MD  Internal Medicine

## 2019-04-19 ENCOUNTER — Other Ambulatory Visit: Payer: Self-pay | Admitting: Nurse Practitioner

## 2019-04-19 DIAGNOSIS — Z0001 Encounter for general adult medical examination with abnormal findings: Secondary | ICD-10-CM | POA: Diagnosis not present

## 2019-04-19 DIAGNOSIS — R7301 Impaired fasting glucose: Secondary | ICD-10-CM | POA: Diagnosis not present

## 2019-04-19 DIAGNOSIS — E559 Vitamin D deficiency, unspecified: Secondary | ICD-10-CM | POA: Diagnosis not present

## 2019-04-19 DIAGNOSIS — R635 Abnormal weight gain: Secondary | ICD-10-CM | POA: Diagnosis not present

## 2019-04-20 LAB — COMPREHENSIVE METABOLIC PANEL
ALT: 19 IU/L (ref 0–32)
AST: 20 IU/L (ref 0–40)
Albumin/Globulin Ratio: 1.7 (ref 1.2–2.2)
Albumin: 4 g/dL (ref 3.8–4.8)
Alkaline Phosphatase: 51 IU/L (ref 39–117)
BUN/Creatinine Ratio: 19 (ref 9–23)
BUN: 13 mg/dL (ref 6–20)
Bilirubin Total: 0.3 mg/dL (ref 0.0–1.2)
CO2: 21 mmol/L (ref 20–29)
Calcium: 8.9 mg/dL (ref 8.7–10.2)
Chloride: 104 mmol/L (ref 96–106)
Creatinine, Ser: 0.68 mg/dL (ref 0.57–1.00)
GFR calc Af Amer: 135 mL/min/{1.73_m2} (ref >59)
GFR calc non Af Amer: 117 mL/min/{1.73_m2} (ref >59)
Globulin, Total: 2.3 g/dL (ref 1.5–4.5)
Glucose: 93 mg/dL (ref 65–99)
Potassium: 4.1 mmol/L (ref 3.5–5.2)
Sodium: 137 mmol/L (ref 134–144)
Total Protein: 6.3 g/dL (ref 6.0–8.5)

## 2019-04-20 LAB — CBC
Hematocrit: 35.7 % (ref 34.0–46.6)
Hemoglobin: 12.1 g/dL (ref 11.1–15.9)
MCH: 29.2 pg (ref 26.6–33.0)
MCHC: 33.9 g/dL (ref 31.5–35.7)
MCV: 86 fL (ref 79–97)
Platelets: 261 10*3/uL (ref 150–450)
RBC: 4.14 x10E6/uL (ref 3.77–5.28)
RDW: 12.9 % (ref 11.7–15.4)
WBC: 7 10*3/uL (ref 3.4–10.8)

## 2019-04-20 LAB — LIPID PANEL W/O CHOL/HDL RATIO
Cholesterol, Total: 174 mg/dL (ref 100–199)
HDL: 67 mg/dL (ref >39)
LDL Chol Calc (NIH): 99 mg/dL (ref 0–99)
Triglycerides: 35 mg/dL (ref 0–149)
VLDL Cholesterol Cal: 8 mg/dL (ref 5–40)

## 2019-04-20 LAB — VITAMIN D 25 HYDROXY (VIT D DEFICIENCY, FRACTURES): Vit D, 25-Hydroxy: 28.8 ng/mL — ABNORMAL LOW (ref 30.0–100.0)

## 2019-04-20 LAB — TSH: TSH: 1.95 u[IU]/mL (ref 0.450–4.500)

## 2019-04-20 LAB — T4, FREE: Free T4: 1.1 ng/dL (ref 0.82–1.77)

## 2019-04-20 LAB — HGB A1C W/O EAG: Hgb A1c MFr Bld: 5.6 % (ref 4.8–5.6)

## 2019-04-22 NOTE — Progress Notes (Signed)
Only mild vtamin d deficiency. Recommend OTC vitamin D3 1000 or 2000iu daily. Other labs were great.

## 2019-05-02 DIAGNOSIS — R300 Dysuria: Secondary | ICD-10-CM | POA: Insufficient documentation

## 2019-05-02 DIAGNOSIS — Z0001 Encounter for general adult medical examination with abnormal findings: Secondary | ICD-10-CM | POA: Insufficient documentation

## 2019-05-02 DIAGNOSIS — R635 Abnormal weight gain: Secondary | ICD-10-CM | POA: Insufficient documentation

## 2019-05-02 DIAGNOSIS — Z8249 Family history of ischemic heart disease and other diseases of the circulatory system: Secondary | ICD-10-CM | POA: Insufficient documentation

## 2019-05-10 ENCOUNTER — Ambulatory Visit (INDEPENDENT_AMBULATORY_CARE_PROVIDER_SITE_OTHER): Payer: BC Managed Care – PPO | Admitting: Obstetrics and Gynecology

## 2019-05-10 ENCOUNTER — Other Ambulatory Visit: Payer: Self-pay

## 2019-05-10 ENCOUNTER — Encounter: Payer: Self-pay | Admitting: Obstetrics and Gynecology

## 2019-05-10 VITALS — BP 130/84 | Ht 65.0 in | Wt 253.0 lb

## 2019-05-10 DIAGNOSIS — Z01419 Encounter for gynecological examination (general) (routine) without abnormal findings: Secondary | ICD-10-CM | POA: Diagnosis not present

## 2019-05-10 DIAGNOSIS — E282 Polycystic ovarian syndrome: Secondary | ICD-10-CM | POA: Insufficient documentation

## 2019-05-10 DIAGNOSIS — Z8041 Family history of malignant neoplasm of ovary: Secondary | ICD-10-CM

## 2019-05-10 DIAGNOSIS — A6004 Herpesviral vulvovaginitis: Secondary | ICD-10-CM | POA: Insufficient documentation

## 2019-05-10 NOTE — Patient Instructions (Signed)
I value your feedback and entrusting us with your care. If you get a [facility] patient survey, I would appreciate you taking the time to let us know about your experience today. Thank you! 

## 2019-05-10 NOTE — Progress Notes (Signed)
PCP:  Carlean JewsBoscia, Heather E, NP   Chief Complaint  Patient presents with  . Gynecologic Exam     HPI:      Ms. Colleen Hernandez is a 33 y.o. No obstetric history on file. who LMP was Patient's last menstrual period was 04/25/2019 (exact date)., presents today for her annual examination.  Her menses are regular every 28-30 days, lasting 5-7 days.  Dysmenorrhea mild, less off OCPs. Takes NSAIDs with sx relief. She does not have intermenstrual bleeding. Was taking OCPs for cycle control due to PCOS but stopped them a few months ago. Menses monthly since, just a little longer and heavier, but tolerable overall. [facility]  Sex activity: not sexually active.  Last Pap: 05/05/18 Results were: no abnormalities /neg HPV DNA Hx of STDs: HPV on pap and HSV, takes valtrex prn. No recent sx, doesn't need RF.  There is no FH of breast cancer. There is a FH of ovarian cancer in her mat aunt, colon cancer in her mat uncle, and stomach cancer in her MGM. Pt has declined genetic testing several times in the past. The patient does do self-breast exams.  Tobacco use: The patient denies current or previous tobacco use. Alcohol use: social drinker No drug use.  Exercise: moderately active  She does get adequate calcium and Vitamin D in her diet.  Labs with PCP.    Past Medical History:  Diagnosis Date  . Cervical dysplasia   . Herpes   . LGSIL on Pap smear of cervix   . Oligomenorrhea   . Pre-diabetes   . Trichomonosis   . Vaccine for human papilloma virus (HPV) types 6, 11, 16, and 18 administered   . Vitamin D deficiency   . Vulvovaginitis     Past Surgical History:  Procedure Laterality Date  . COLPOSCOPY    . TONSILLECTOMY    . WISDOM TOOTH EXTRACTION      Family History  Problem Relation Age of Onset  . Stomach cancer Maternal Grandmother 6649  . Diabetes Paternal Grandmother   . Heart disease Paternal Grandfather   . Colon cancer Maternal Uncle 56  . Diabetes  Maternal Uncle   . Diabetes Maternal Aunt   . Ovarian cancer Maternal Aunt 56  . Heart disease Father     Social History   Socioeconomic History  . Marital status: Single    Spouse name: Not on file  . Number of children: Not on file  . Years of education: Not on file  . Highest education level: Not on file  Occupational History  . Not on file  Social Needs  . Financial resource strain: Not on file  . Food insecurity    Worry: Not on file    Inability: Not on file  . Transportation needs    Medical: Not on file    Non-medical: Not on file  Tobacco Use  . Smoking status: Never Smoker  . Smokeless tobacco: Never Used  Substance and Sexual Activity  . Alcohol use: Yes    Alcohol/week: 0.0 standard drinks    Comment: socially  . Drug use: No  . Sexual activity: Not Currently    Birth control/protection: Pill  Lifestyle  . Physical activity    Days per week: Not on file    Minutes per session: Not on file  . Stress: Not on file  Relationships  . Social connections    Talks on phone: Not on file  Gets together: Not on file    Attends religious service: Not on file    Active member of club or organization: Not on file    Attends meetings of clubs or organizations: Not on file    Relationship status: Not on file  . Intimate partner violence    Fear of current or ex partner: Not on file    Emotionally abused: Not on file    Physically abused: Not on file    Forced sexual activity: Not on file  Other Topics Concern  . Not on file  Social History Narrative  . Not on file    Current Meds  Medication Sig  . fexofenadine-pseudoephedrine (ALLEGRA-D) 60-120 MG 12 hr tablet Take 1 tablet by mouth 2 (two) times daily.  Marland Kitchen levonorgestrel-ethinyl estradiol (AVIANE,ALESSE,LESSINA) 0.1-20 MG-MCG tablet Take 1 tablet by mouth daily.     ROS:  Review of Systems  Constitutional: Negative for fatigue, fever and unexpected weight change.  Respiratory: Negative for cough,  shortness of breath and wheezing.   Cardiovascular: Negative for chest pain, palpitations and leg swelling.  Gastrointestinal: Negative for blood in stool, constipation, diarrhea, nausea and vomiting.  Endocrine: Negative for cold intolerance, heat intolerance and polyuria.  Genitourinary: Negative for dyspareunia, dysuria, flank pain, frequency, genital sores, hematuria, menstrual problem, pelvic pain, urgency, vaginal bleeding, vaginal discharge and vaginal pain.  Musculoskeletal: Negative for back pain, joint swelling and myalgias.  Skin: Negative for rash.  Neurological: Negative for dizziness, syncope, light-headedness, numbness and headaches.  Hematological: Negative for adenopathy.  Psychiatric/Behavioral: Negative for agitation, confusion, sleep disturbance and suicidal ideas. The patient is not nervous/anxious.      Objective: BP 130/84   Ht 5\' 5"  (1.651 m)   Wt 253 lb (114.8 kg)   LMP 04/25/2019 (Exact Date)   BMI 42.10 kg/m    Physical Exam Constitutional:      Appearance: She is well-developed.  Genitourinary:     Vulva, uterus, right adnexa and left adnexa normal.     No vulval lesion or tenderness noted.     Vaginal bleeding present.     No vaginal discharge, erythema or tenderness.     No cervical motion tenderness or polyp.     Uterus is not enlarged or tender.     No right or left adnexal mass present.     Right adnexa not tender.     Left adnexa not tender.  Neck:     Musculoskeletal: Normal range of motion.     Thyroid: No thyromegaly.  Cardiovascular:     Rate and Rhythm: Normal rate and regular rhythm.     Heart sounds: Normal heart sounds. No murmur.  Pulmonary:     Effort: Pulmonary effort is normal.     Breath sounds: Normal breath sounds.  Chest:     Breasts:        Right: No mass, nipple discharge, skin change or tenderness.        Left: No mass, nipple discharge, skin change or tenderness.  Abdominal:     Palpations: Abdomen is soft.      Tenderness: There is no abdominal tenderness. There is no guarding.  Musculoskeletal: Normal range of motion.  Neurological:     General: No focal deficit present.     Mental Status: She is alert and oriented to person, place, and time.     Cranial Nerves: No cranial nerve deficit.  Skin:    General: Skin is warm and dry.  Psychiatric:  Mood and Affect: Mood normal.        Behavior: Behavior normal.        Thought Content: Thought content normal.        Judgment: Judgment normal.  Vitals signs reviewed.     Assessment/Plan: Encounter for annual routine gynecological examination  PCOS (polycystic ovarian syndrome)--Having monthly menses off OCPs recently. Pt to follow cycles and f/u for OCP restart if becomes amenorrheic again.   Family history of ovarian cancer--MyRisk testing discussed and pt declines. F/u if desires.  Herpes simplex vulvovaginitis--no recent sx, no need for Rx RF.            GYN counsel adequate intake of calcium and vitamin D, diet and exercise     F/U  Return in about 1 year (around 05/09/2020).  Fadumo Heng B. Onedia Vargus, PA-C 05/10/2019 7:18 PM

## 2020-06-21 ENCOUNTER — Ambulatory Visit: Payer: BC Managed Care – PPO | Admitting: Obstetrics and Gynecology

## 2020-08-03 ENCOUNTER — Ambulatory Visit: Payer: BC Managed Care – PPO | Admitting: Obstetrics and Gynecology

## 2020-09-08 NOTE — Patient Instructions (Signed)
I value your feedback and you entrusting us with your care. If you get a [facility] patient survey, I would appreciate you taking the time to let us know about your experience today. Thank you! ? ? ?

## 2020-09-08 NOTE — Progress Notes (Signed)
PCP:  Carlean Jews, NP   Chief Complaint  Patient presents with  . Gynecologic Exam    No concerns     HPI:      Ms. Colleen Hernandez is a 35 y.o. No obstetric history on file. who LMP was Patient's last menstrual period was 09/03/2020 (approximate)., presents today for her annual examination.  Her menses are regular every 28-30 days, lasting 5 days.  Dysmenorrhea mild, less off OCPs. Takes NSAIDs with sx relief. She does not have intermenstrual bleeding. Was taking OCPs for cycle control due to PCOS but stopped them over a yr ago. Menses monthly since, just a little longer and heavier, but tolerable overall. Would prefer to not be on [facility]; doing diet changes.    Sex activity: not sexually active.  Last Pap: 05/05/18 Results were: no abnormalities /neg HPV DNA. Repeat due next yr. Hx of STDs: HPV on pap, trich in past, and HSV, takes valtrex prn. No recent sx, doesn't need RF.  There is no FH of breast cancer. There is a FH of ovarian cancer in her mat aunt, colon cancer in her mat uncle, and stomach cancer in her MGM. Pt has declined genetic testing several times in the past. The patient does self-breast exams.  Tobacco use: The patient denies current or previous tobacco use. Alcohol use: social drinker No drug use.  Exercise: moderately active  She does get adequate calcium and Vitamin D in her diet.  Labs with PCP.    Past Medical History:  Diagnosis Date  . Cervical dysplasia   . Herpes   . LGSIL on Pap smear of cervix   . Oligomenorrhea   . Pre-diabetes   . Trichomonosis   . Vaccine for human papilloma virus (HPV) types 6, 11, 16, and 18 administered   . Vitamin D deficiency   . Vulvovaginitis     Past Surgical History:  Procedure Laterality Date  . COLPOSCOPY    . TONSILLECTOMY    . WISDOM TOOTH EXTRACTION      Family History  Problem Relation Age of Onset  . Stomach cancer Maternal Grandmother 70  . Diabetes Paternal Grandmother   . Heart disease  Paternal Grandfather   . Colon cancer Maternal Uncle 56  . Diabetes Maternal Uncle   . Diabetes Maternal Aunt   . Ovarian cancer Maternal Aunt 56  . Heart disease Father     Social History   Socioeconomic History  . Marital status: Single    Spouse name: Not on file  . Number of children: Not on file  . Years of education: Not on file  . Highest education level: Not on file  Occupational History  . Not on file  Tobacco Use  . Smoking status: Never Smoker  . Smokeless tobacco: Never Used  Vaping Use  . Vaping Use: Never used  Substance and Sexual Activity  . Alcohol use: Yes    Alcohol/week: 0.0 standard drinks    Comment: socially  . Drug use: No  . Sexual activity: Not Currently    Birth control/protection: None  Other Topics Concern  . Not on file  Social History Narrative  . Not on file   Social Determinants of Health   Financial Resource Strain: Not on file  Food Insecurity: Not on file  Transportation Needs: Not on file  Physical Activity: Not on file  Stress: Not on file  Social Connections: Not on file  Intimate Partner Violence: Not on file  No outpatient medications have been marked as taking for the 09/11/20 encounter (Office Visit) with Romanita Fager, Ilona Sorrel, PA-C.     ROS:  Review of Systems  Constitutional: Negative for fatigue, fever and unexpected weight change.  Respiratory: Negative for cough, shortness of breath and wheezing.   Cardiovascular: Negative for chest pain, palpitations and leg swelling.  Gastrointestinal: Negative for blood in stool, constipation, diarrhea, nausea and vomiting.  Endocrine: Negative for cold intolerance, heat intolerance and polyuria.  Genitourinary: Negative for dyspareunia, dysuria, flank pain, frequency, genital sores, hematuria, menstrual problem, pelvic pain, urgency, vaginal bleeding, vaginal discharge and vaginal pain.  Musculoskeletal: Negative for back pain, joint swelling and myalgias.  Skin: Negative for  rash.  Neurological: Negative for dizziness, syncope, light-headedness, numbness and headaches.  Hematological: Negative for adenopathy.  Psychiatric/Behavioral: Negative for agitation, confusion, sleep disturbance and suicidal ideas. The patient is not nervous/anxious.      Objective: BP 122/70   Ht 5\' 5"  (1.651 m)   Wt 251 lb (113.9 kg)   LMP 09/03/2020 (Approximate)   BMI 41.77 kg/m    Physical Exam Constitutional:      Appearance: She is well-developed.  Genitourinary:     Vulva normal.     Right Labia: No rash, tenderness or lesions.    Left Labia: No tenderness, lesions or rash.    No vaginal discharge, erythema or tenderness.      Right Adnexa: not tender and no mass present.    Left Adnexa: not tender and no mass present.    No cervical friability or polyp.     Uterus is not enlarged or tender.  Breasts:     Right: No mass, nipple discharge, skin change or tenderness.     Left: No mass, nipple discharge, skin change or tenderness.    Neck:     Thyroid: No thyromegaly.  Cardiovascular:     Rate and Rhythm: Normal rate and regular rhythm.     Heart sounds: Normal heart sounds. No murmur heard.   Pulmonary:     Effort: Pulmonary effort is normal.     Breath sounds: Normal breath sounds.  Abdominal:     Palpations: Abdomen is soft.     Tenderness: There is no abdominal tenderness. There is no guarding or rebound.  Musculoskeletal:        General: Normal range of motion.     Cervical back: Normal range of motion.  Lymphadenopathy:     Cervical: No cervical adenopathy.  Neurological:     General: No focal deficit present.     Mental Status: She is alert and oriented to person, place, and time.     Cranial Nerves: No cranial nerve deficit.  Skin:    General: Skin is warm and dry.  Psychiatric:        Mood and Affect: Mood normal.        Behavior: Behavior normal.        Thought Content: Thought content normal.        Judgment: Judgment normal.  Vitals  reviewed.     Assessment/Plan: Encounter for annual routine gynecological examination  PCOS (polycystic ovarian syndrome)--Having monthly menses off OCPs. Pt to f/u prn amenorrhea. Has done diet changes.   Family history of ovarian cancer--MyRisk testing discussed and pt declines. F/u if desires.  Herpes simplex vulvovaginitis--no recent sx, no need for Rx RF.            GYN counsel adequate intake of calcium and vitamin D, diet and  exercise     F/U  Return in about 1 year (around 09/11/2021).  Persephonie Hegwood B. Jamiah Homeyer, PA-C 09/12/2020 10:52 AM

## 2020-09-11 ENCOUNTER — Encounter: Payer: Self-pay | Admitting: Obstetrics and Gynecology

## 2020-09-11 ENCOUNTER — Other Ambulatory Visit: Payer: Self-pay

## 2020-09-11 ENCOUNTER — Ambulatory Visit (INDEPENDENT_AMBULATORY_CARE_PROVIDER_SITE_OTHER): Payer: No Typology Code available for payment source | Admitting: Obstetrics and Gynecology

## 2020-09-11 VITALS — BP 122/70 | Ht 65.0 in | Wt 251.0 lb

## 2020-09-11 DIAGNOSIS — E282 Polycystic ovarian syndrome: Secondary | ICD-10-CM | POA: Diagnosis not present

## 2020-09-11 DIAGNOSIS — A6004 Herpesviral vulvovaginitis: Secondary | ICD-10-CM

## 2020-09-11 DIAGNOSIS — Z01419 Encounter for gynecological examination (general) (routine) without abnormal findings: Secondary | ICD-10-CM | POA: Diagnosis not present

## 2020-09-11 DIAGNOSIS — Z8041 Family history of malignant neoplasm of ovary: Secondary | ICD-10-CM | POA: Diagnosis not present
